# Patient Record
Sex: Female | Born: 1995 | Race: White | Hispanic: No | Marital: Single | Smoking: Never smoker
Health system: Southern US, Community
[De-identification: ages and names within clinical notes are randomized; demographics above are authoritative.]

---

## 2018-04-03 ENCOUNTER — Encounter (HOSPITAL_COMMUNITY): Payer: Self-pay | Admitting: Emergency Medicine

## 2018-04-03 ENCOUNTER — Other Ambulatory Visit: Payer: Self-pay

## 2018-04-03 ENCOUNTER — Emergency Department (HOSPITAL_COMMUNITY)
Admission: EM | Admit: 2018-04-03 | Discharge: 2018-04-03 | Disposition: A | Discharge status: Home / Self Care | Payer: Self-pay | Attending: Emergency Medicine | Admitting: Emergency Medicine

## 2018-04-03 ENCOUNTER — Emergency Department (HOSPITAL_COMMUNITY): Payer: Self-pay

## 2018-04-03 DIAGNOSIS — M79672 Pain in left foot: Secondary | ICD-10-CM | POA: Insufficient documentation

## 2018-04-03 MED ORDER — IBUPROFEN 400 MG PO TABS
400.0000 mg | ORAL_TABLET | Freq: Once | ORAL | Status: AC | PRN
  Administered 2018-04-03: 400 mg via ORAL
  Filled 2018-04-03: qty 1

## 2018-04-03 MED ORDER — NAPROXEN 500 MG PO TABS: 500.0000 mg | ORAL_TABLET | Freq: Two times a day (BID) | ORAL | 0 refills | Status: AC

## 2018-04-03 NOTE — ED Triage Notes (Signed)
Pt reports tripping off side of curb and injuring left foot last night. Pt reports pain with movement and palpation. Unable to ambulate. Pulse and sensory intact. Pt reports taking ibuprofen with minimal relief. Pt reports hx of left foot fx 3 years ago. Ice pack applied in triage.

## 2018-04-03 NOTE — ED Notes (Signed)
ED Provider at bedside. 

## 2018-04-03 NOTE — ED Notes (Signed)
Patient verbalizes understanding of discharge instructions. Opportunity for questioning and answers were provided. Armband removed by staff, pt discharged from ED ambulatory with cam walker.

## 2018-04-03 NOTE — ED Provider Notes (Signed)
[facility] EMERGENCY DEPARTMENT Provider Note   CSN: [id number] Arrival date & time: 04/03/18  1704     History   Chief Complaint Chief Complaint  Patient presents with  . Foot Injury    HPI Roberta Johnson is a 22 y.o. female with no significant past medical history presents emergency department today for left arch pain.  Patient reports of the last several months she has been having pain in her left arch that occurs usually in the morning time when she first ambulates.  She notes that it is also worse after periods of immobilization and walking.  She reports that yesterday she had significant increase of her pain when she tripped off of a curve and caught herself with her left foot.  She notes the area is very sharp and rates her pain as 8/10.  She is tried ibuprofen for symptoms with minimal relief.  She does note a history of a foot fracture 3 years ago.  She denies any pain in the ankle, fever, joint swelling, open wounds, numbness/tingling, limited range of motion or weakness.  HPI  History reviewed. No pertinent past medical history.  There are no active problems to display for this patient.   History reviewed. No pertinent surgical history.   OB History   None      Home Medications    Prior to Admission medications   Not on File    Family History No family history on file.  Social History Social History   Tobacco Use  . Smoking status: Never Smoker  . Smokeless tobacco: Never Used  Substance Use Topics  . Alcohol use: Never    Frequency: Never  . Drug use: Never     Allergies   Bee venom   Review of Systems Review of Systems  Constitutional: Negative for fever.  Musculoskeletal: Positive for arthralgias. Negative for gait problem.  Skin: Negative for color change and wound.  Neurological: Negative for weakness and numbness.  All other systems reviewed and are negative.    Physical Exam Updated Vital Signs BP 102/71  (BP Location: Right Arm)   Pulse 61   Temp 98.7 F (37.1 C) (Oral)   Resp 16   Ht 5\' 3"  (1.6 m)   Wt 59 kg   SpO2 100%   BMI 23.03 kg/m   Physical Exam  Constitutional: She appears well-developed and well-nourished.  HENT:  Head: Normocephalic and atraumatic.  Right Ear: External ear normal.  Left Ear: External ear normal.  Eyes: Conjunctivae are normal. Right eye exhibits no discharge. Left eye exhibits no discharge. No scleral icterus.  Cardiovascular:  Pulses:      Dorsalis pedis pulses are 2+ on the right side, and 2+ on the left side.       Posterior tibial pulses are 2+ on the right side, and 2+ on the left side.  Negative Homans test  Pulmonary/Chest: Effort normal. No respiratory distress.  Musculoskeletal:       Left knee: Normal. No tenderness found.       Left ankle: She exhibits normal range of motion, no swelling and no ecchymosis. No tenderness.       Left lower leg: Normal.       Left foot: There is tenderness (arch). There is normal range of motion, no bony tenderness, no swelling and normal capillary refill.       Feet:  Neurovascularly intact distally. Compartments soft above and below affected joint.   Neurological: She is alert. She  has normal strength. No sensory deficit.  No foot drop  Skin: Skin is warm, dry and intact. Capillary refill takes less than 2 seconds. No abrasion and no laceration noted. No erythema. No pallor.  Psychiatric: She has a normal mood and affect.  Nursing note and vitals reviewed.    ED Treatments / Results  Labs (all labs ordered are listed, but only abnormal results are displayed) Labs Reviewed - No data to display  EKG None  Radiology Dg Ankle Complete Left  Result Date: 04/03/2018 CLINICAL DATA:  Injury EXAM: LEFT ANKLE COMPLETE - 3+ VIEW COMPARISON:  None. FINDINGS: No acute fracture. No dislocation.  Unremarkable soft tissues. IMPRESSION: No acute bony pathology. Electronically Signed   By: Jolaine Click M.D.   On:  04/03/2018 18:56   Dg Foot Complete Left  Result Date: 04/03/2018 CLINICAL DATA:  Injury EXAM: LEFT FOOT - COMPLETE 3+ VIEW COMPARISON:  None. FINDINGS: No acute fracture. No dislocation.  Unremarkable soft tissues. IMPRESSION: No acute bony pathology. Electronically Signed   By: Jolaine Click M.D.   On: 04/03/2018 18:57    Procedures Procedures (including critical care time)  Medications Ordered in ED Medications  ibuprofen (ADVIL,MOTRIN) tablet 400 mg (400 mg Oral Given 04/03/18 1745)     Initial Impression / Assessment and Plan / ED Course  I have reviewed the triage vital signs and the nursing notes.  Pertinent labs & imaging results that were available during my care of the patient were reviewed by me and considered in my medical decision making (see chart for details).     22 y.o. female with times consistent with plantar fasciitis.  Doubt DVT.  She is neurovascular intact.  Compartments are soft.  X-rays obtained in triage are negative.  This was reviewed by myself.  Pain was controlled in the department.  Given cam walker for comfort.  Advised purchasing arch support.  Will give instructions on stretches to perform at home.  Referral to podiatry given.  Return precautions were discussed.  Patient appears safe for discharge.  Final Clinical Impressions(s) / ED Diagnoses   Final diagnoses:  Left foot pain    ED Discharge Orders         Ordered    naproxen (NAPROSYN) 500 MG tablet  2 times daily     04/03/18 2059           Princella Pellegrini 04/03/18 2100    Sabas Sous, MD 04/04/18 [id number]

## 2019-01-10 IMAGING — CR DG FOOT COMPLETE 3+V*L*
3 series · 3 of 3 positions shown · non-contrast
Comparison: None.

CLINICAL DATA: Injury

EXAM:
LEFT FOOT - COMPLETE 3+ VIEW

[foot ap]
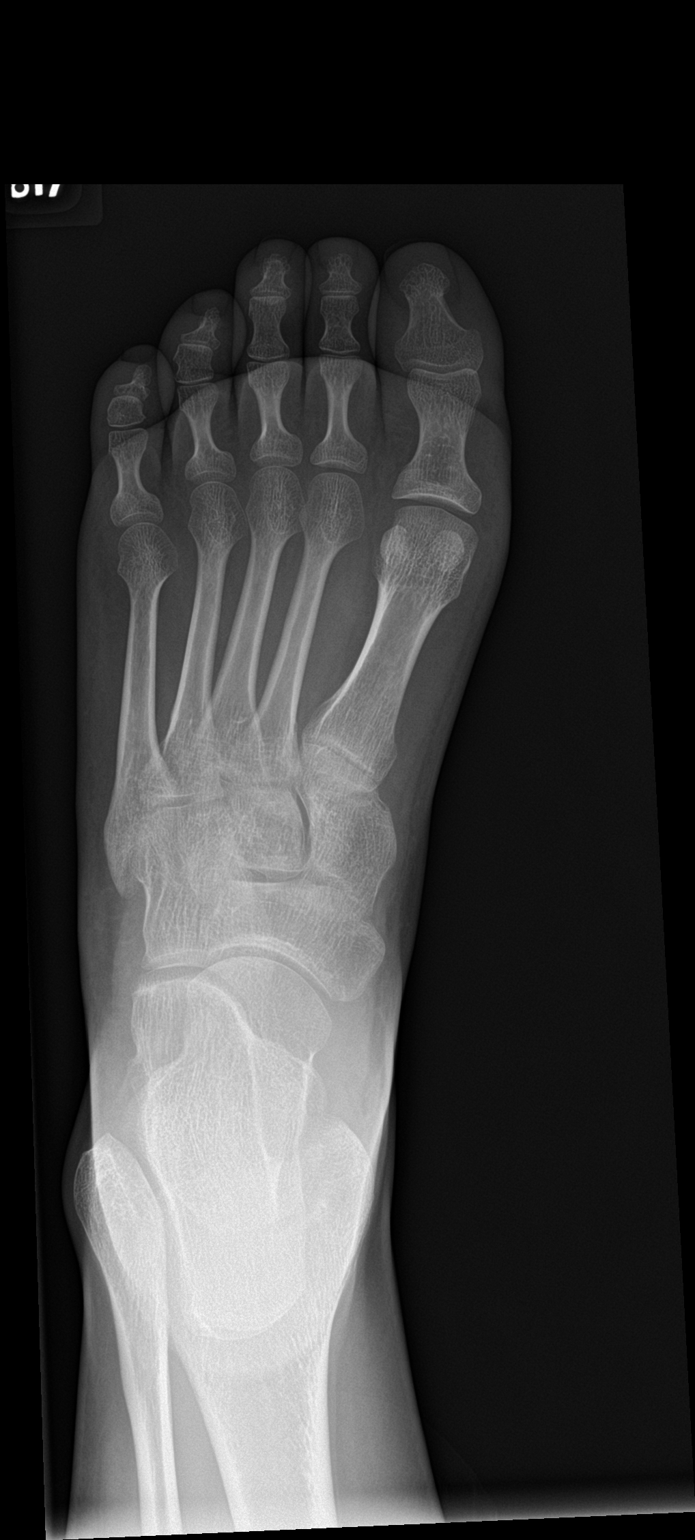

[foot obl]
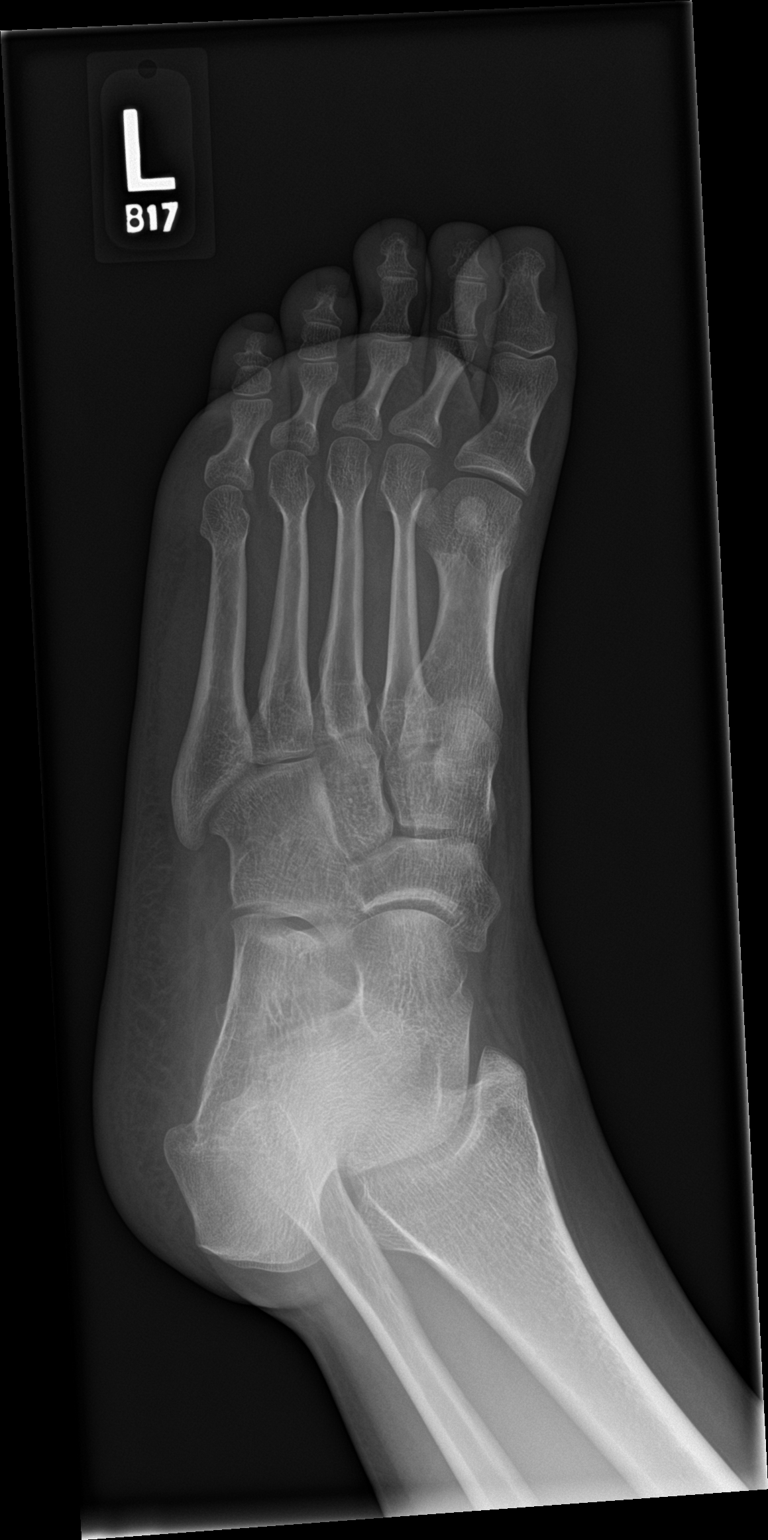

[foot lat]
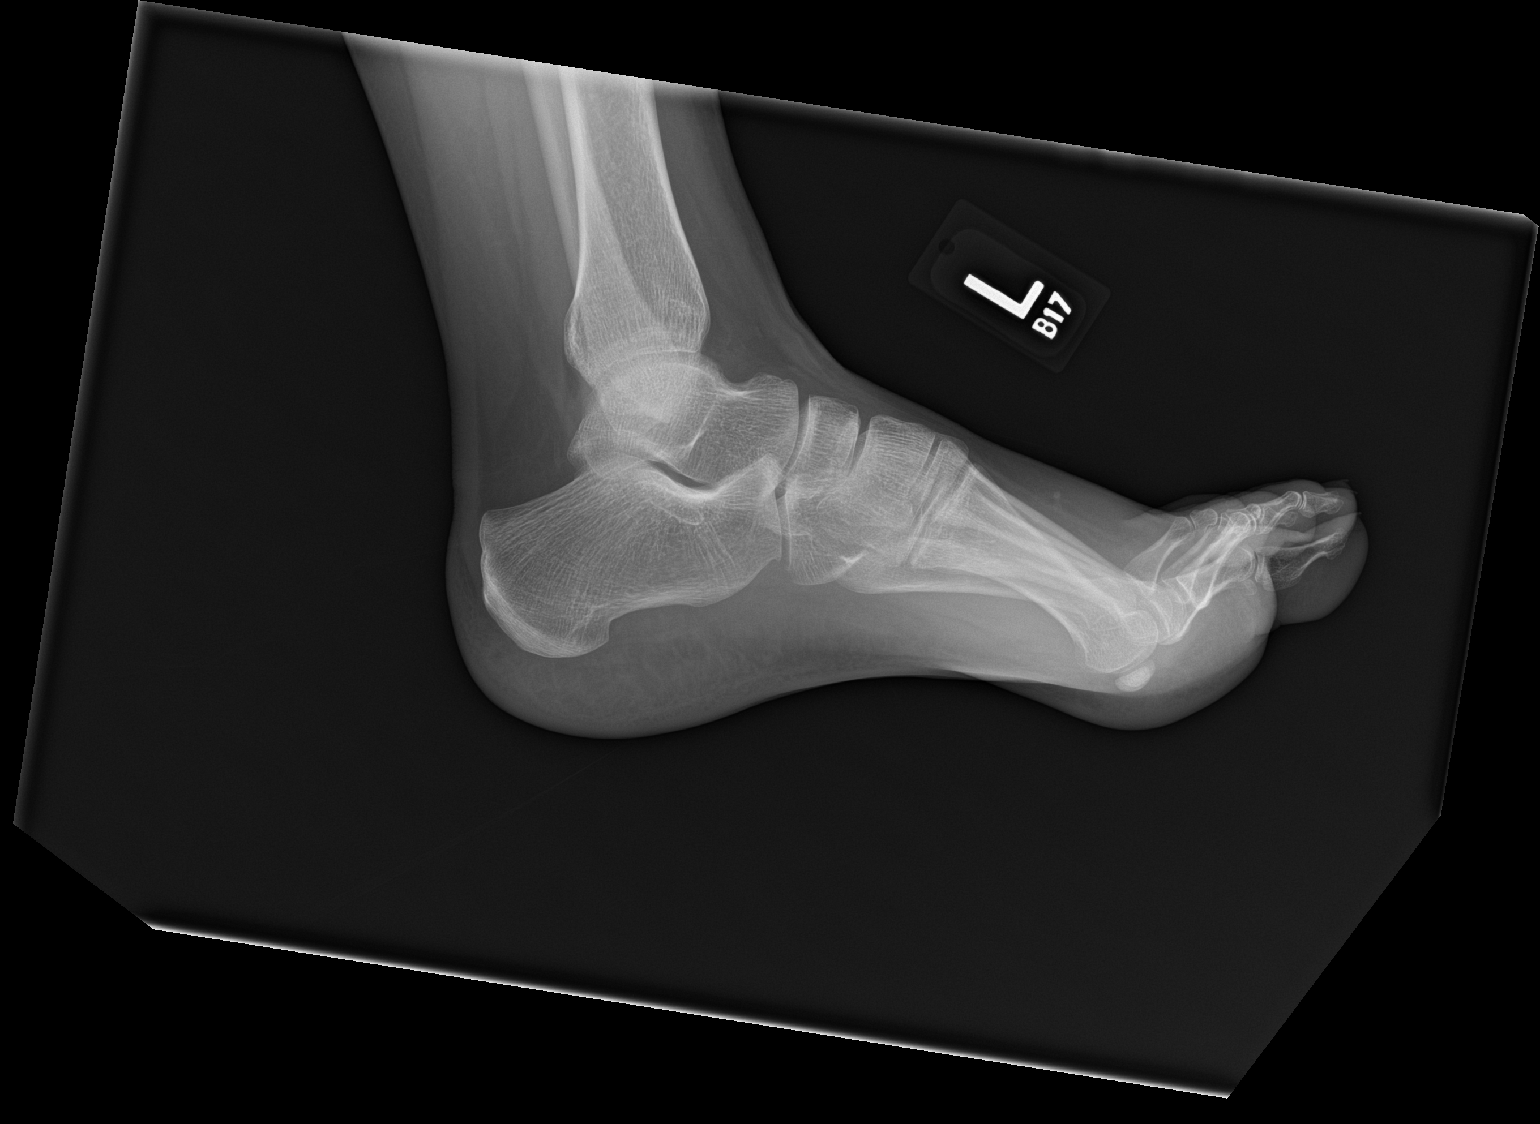

[3 of 3 positions shown; findings below may reference images not displayed]

FINDINGS: No acute fracture. No dislocation.  Unremarkable soft tissues.
IMPRESSION: No acute bony pathology.
